# Patient Record
Sex: Female | Born: 1975 | Hispanic: No | Marital: Married | Smoking: Never smoker
Health system: Southern US, Community
[De-identification: ages and names within clinical notes are randomized; demographics above are authoritative.]

## PROBLEM LIST (undated history)

## (undated) HISTORY — PX: TUBAL LIGATION: SHX77

---

## 2009-06-15 ENCOUNTER — Inpatient Hospital Stay (HOSPITAL_COMMUNITY): Admission: RE | Admit: 2009-06-15 | Discharge: 2009-06-17 | Payer: Self-pay | Admitting: Obstetrics and Gynecology

## 2009-06-15 ENCOUNTER — Encounter (INDEPENDENT_AMBULATORY_CARE_PROVIDER_SITE_OTHER): Payer: Self-pay | Admitting: Obstetrics and Gynecology

## 2010-04-12 LAB — CBC
HCT: 38.3 % (ref 36.0–46.0)
Hemoglobin: 13.3 g/dL (ref 12.0–15.0)
MCHC: 34.4 g/dL (ref 30.0–36.0)
MCV: 93.3 fL (ref 78.0–100.0)
MCV: 94 fL (ref 78.0–100.0)
Platelets: 272 10*3/uL (ref 150–400)
RBC: 3.64 MIL/uL — ABNORMAL LOW (ref 3.87–5.11)
RDW: 13.9 % (ref 11.5–15.5)
RDW: 14 % (ref 11.5–15.5)

## 2014-12-01 ENCOUNTER — Other Ambulatory Visit: Payer: Self-pay | Admitting: Obstetrics and Gynecology

## 2014-12-01 DIAGNOSIS — N6489 Other specified disorders of breast: Secondary | ICD-10-CM

## 2014-12-04 ENCOUNTER — Ambulatory Visit
Admission: RE | Admit: 2014-12-04 | Discharge: 2014-12-04 | Disposition: A | Discharge status: Home / Self Care | Payer: BLUE CROSS/BLUE SHIELD | Source: Ambulatory Visit | Attending: Obstetrics and Gynecology | Admitting: Obstetrics and Gynecology

## 2014-12-04 DIAGNOSIS — N6489 Other specified disorders of breast: Secondary | ICD-10-CM

## 2016-08-24 LAB — LIPID PANEL
Cholesterol: 171 (ref 0–200)
HDL: 66 (ref 35–70)

## 2016-08-24 LAB — BASIC METABOLIC PANEL: Glucose: 88

## 2016-11-21 DIAGNOSIS — R1011 Right upper quadrant pain: Secondary | ICD-10-CM | POA: Diagnosis not present

## 2016-11-24 ENCOUNTER — Ambulatory Visit (INDEPENDENT_AMBULATORY_CARE_PROVIDER_SITE_OTHER): Payer: BLUE CROSS/BLUE SHIELD | Admitting: Family Medicine

## 2016-11-24 ENCOUNTER — Encounter: Payer: Self-pay | Admitting: Family Medicine

## 2016-11-24 VITALS — BP 142/86 | HR 91 | Temp 97.9°F | Ht 64.0 in | Wt 133.4 lb

## 2016-11-24 DIAGNOSIS — R1011 Right upper quadrant pain: Secondary | ICD-10-CM

## 2016-11-24 DIAGNOSIS — R1114 Bilious vomiting: Secondary | ICD-10-CM

## 2016-11-24 DIAGNOSIS — K8020 Calculus of gallbladder without cholecystitis without obstruction: Secondary | ICD-10-CM | POA: Diagnosis not present

## 2016-11-24 LAB — COMPREHENSIVE METABOLIC PANEL
ALT: 18 U/L (ref 0–35)
AST: 15 U/L (ref 0–37)
Albumin: 4.2 g/dL (ref 3.5–5.2)
Alkaline Phosphatase: 48 U/L (ref 39–117)
BILIRUBIN TOTAL: 0.6 mg/dL (ref 0.2–1.2)
BUN: 6 mg/dL (ref 6–23)
CALCIUM: 9.1 mg/dL (ref 8.4–10.5)
CHLORIDE: 104 meq/L (ref 96–112)
CO2: 27 meq/L (ref 19–32)
Creatinine, Ser: 0.66 mg/dL (ref 0.40–1.20)
GFR: 104.24 mL/min (ref >60.00)
Glucose, Bld: 108 mg/dL — ABNORMAL HIGH (ref 70–99)
Potassium: 3.9 mEq/L (ref 3.5–5.1)
Sodium: 138 mEq/L (ref 135–145)
Total Protein: 7.6 g/dL (ref 6.0–8.3)

## 2016-11-24 LAB — CBC
HCT: 40.2 % (ref 36.0–46.0)
HEMOGLOBIN: 13.6 g/dL (ref 12.0–15.0)
MCHC: 33.8 g/dL (ref 30.0–36.0)
MCV: 94.5 fl (ref 78.0–100.0)
Platelets: 273 10*3/uL (ref 150.0–400.0)
RBC: 4.25 Mil/uL (ref 3.87–5.11)
RDW: 12.4 % (ref 11.5–15.5)
WBC: 9.8 10*3/uL (ref 4.0–10.5)

## 2016-11-24 LAB — POCT URINE PREGNANCY: Preg Test, Ur: NEGATIVE

## 2016-11-24 LAB — LIPASE: LIPASE: 15 U/L (ref 11.0–59.0)

## 2016-11-24 NOTE — Patient Instructions (Addendum)
It was nice to see you today!  I am going to check your blood count and pancreatic enzymes today Then we will plan for an ultrasound tomorrow am at 9:30- nothing to eat or drink for at least 6 hours prior please!   Go to the imaging services suite on the ground floor [facility] the [facility] for your [facility]-I will be in touch with your result tomorrow  If anything unexpected happens tonight- severe pain, fever, etc- please go to the ER

## 2016-11-24 NOTE — Progress Notes (Addendum)
[facility] [address], [address] [place], [place] [id number] [phone]  Date:  11/24/2016   Name:  Melanie CuffRoxana L Mcgrath   DOB:  [birth year]   MRN:  [id number]  PCP:  Patient, No Pcp Per    Chief Complaint: Abdominal Pain (vomit last Thursday, feeling bloating, right side abdominal pain that comes and goes. Pt states pain is noticied more after eating. )   History of Present Illness:  Melanie Mcgrath is a 41 y.o. very pleasant female patient who presents with the following:  She is a new patient to use today- is here today with concern of abdominal pain   A week ago today she went out to eat- after the meal she felt bad, threw up a couple of times.   The next day she noted more generalized belly discomfort.  This waxed and waned over the next couple of days, and on Monday she went to UC. They did not find any cause for alarm, but did recommend following up with PCP.  They checked a CMP and was well per her report although we do not have a copy of her labs  However, she continues to notice mild RUQ discomfort which is worse after eating.    She has never had this issue in the past  No family history of gallstones that she knows of She is generally in good health No further vomiting No diarrhea No fever  She has 11 child- 233 year old daughter. She is in good health   LMP was about one month ago No urinary sx   There are no active problems to display for this patient.   History reviewed. No pertinent past medical history.  Past Surgical History:  Procedure Laterality Date  . CESAREAN SECTION  05/23/20111    Social History  Substance Use Topics  . Smoking status: Not on file  . Smokeless tobacco: Not on file  . Alcohol use Not on file    Family History  Problem Relation Age of Onset  . Hypertension Mother     Allergies not on file  Medication list has been reviewed and updated.  No current outpatient  prescriptions on file prior to visit.   No current facility-administered medications on file prior to visit.     Review of Systems:  As per HPI- otherwise negative.   Physical Examination: Vitals:   11/24/16 1104  BP: (!) 144/84  Pulse: 91  Temp: 97.9 F (36.6 C)  SpO2: 100%   Vitals:   11/24/16 1104  Weight: 133 lb 6.4 oz (60.5 kg)  Height: 5\' 4"  (1.626 m)   Body mass index is 22.9 kg/m. Ideal Body Weight: Weight in (lb) to have BMI = 25: 145.3  GEN: WDWN, NAD, Non-toxic, A & O x 3 HEENT: Atraumatic, Normocephalic. Neck supple. No masses, No LAD. Ears and Nose: No external deformity. CV: RRR, No M/G/R. No JVD. No thrill. No extra heart sounds. PULM: CTA B, no wheezes, crackles, rhonchi. No retractions. No resp. distress. No accessory muscle use. ABD: S, NT, ND, +BS. No rebound. No HSM. EXTR: No c/c/e NEURO Normal gait.  PSYCH: Normally interactive. Conversant. Not depressed or anxious appearing.  Calm demeanor.   Results for orders placed or performed in visit on 11/24/16  POCT urine pregnancy  Result Value Ref Range   Preg Test, Ur Negative Negative     Assessment and Plan: RUQ pain - Plan: Lipase  Bilious vomiting without nausea - Plan: CBC, Comprehensive metabolic panel, US Abdomen Limited RUQ, POCT urine pregnancy  Here today with RUQ discomfort suspicious for gallstones.  Her sx are not currently severe enough to suggest cholecystitis.  Will obtain labs as above and set her up for an [place] tomorrow- we are not able to get this today as she recently ate  Will plan further follow- up pending labs. If any serious change in her sx she will seek emergency care Will see her for a routine visit in a couple of months   Signed Abbe Amsterdam, MD shchedulled for an [place] tomorrow 9:30 am, NPO for 6 hours prior   Received labs  Results for orders placed or performed in visit on 11/24/16  CBC  Result Value Ref Range   WBC 9.8 4.0 - 10.5 K/uL   RBC 4.25 3.87 -  5.11 Mil/uL   Platelets 273.0 150.0 - 400.0 K/uL   Hemoglobin 13.6 12.0 - 15.0 g/dL   HCT 40.9 81.1 - 91.4 %   MCV 94.5 78.0 - 100.0 fl   MCHC 33.8 30.0 - 36.0 g/dL   RDW 78.2 95.6 - 21.3 %  Comprehensive metabolic panel  Result Value Ref Range   Sodium 138 135 - 145 mEq/L   Potassium 3.9 3.5 - 5.1 mEq/L   Chloride 104 96 - 112 mEq/L   CO2 27 19 - 32 mEq/L   Glucose, Bld 108 (H) 70 - 99 mg/dL   BUN 6 6 - 23 mg/dL   Creatinine, Ser 0.86 0.40 - 1.20 mg/dL   Total Bilirubin 0.6 0.2 - 1.2 mg/dL   Alkaline Phosphatase 48 39 - 117 U/L   AST 15 0 - 37 U/L   ALT 18 0 - 35 U/L   Total Protein 7.6 6.0 - 8.3 g/dL   Albumin 4.2 3.5 - 5.2 g/dL   Calcium 9.1 8.4 - 57.8 mg/dL   GFR 469.62 >95.28 mL/min  Lipase  Result Value Ref Range   Lipase 15.0 11.0 - 59.0 U/L  POCT urine pregnancy  Result Value Ref Range   Preg Test, Ur Negative Negative   Await [place] tomorrow  Received her [place] on 11/2-  Called pt, she is feeling better, pain is decreased, she is eating.  Discussed her case with general surgery MD- her clinical picture is not emergent, will have her seen next week Advised to pt eat a low fat diet,.  If any worsening over the weekend she will go to the ER or call me US Abdomen Limited Ruq  Result Date: 11/25/2016 CLINICAL DATA:  Vomiting.  Bloating. EXAM: ULTRASOUND ABDOMEN LIMITED RIGHT UPPER QUADRANT COMPARISON:  No prior . FINDINGS: Gallbladder: Multiple gallstones. Gallbladder wall is thickened at 10.5 mm. Mild amount of pericholecystic fluid noted. Cholecystitis cannot be excluded. Negative Murphy sign. Common bile duct: Diameter: 5.5 mm Liver: Two tiny hyperechoic hepatic nodules measuring 7 and 6 mm are noted, most likely tiny hemangiomas. Portal vein is patent on color Doppler imaging with normal direction of blood flow towards the liver. IMPRESSION: 1. Multiple gallstones. Prominent thickening of the gallbladder wall at 10.5 mm. Mild amount of pericholecystic fluid. Cholecystitis cannot  be excluded. No biliary distention. 2. Two tiny 7 at 6 mm hyperechoic hepatic nodules, most likely tiny hemangiomas P Electronically Signed   ByMaisie Fus  Register   On: 11/25/2016 10:20

## 2016-11-25 ENCOUNTER — Ambulatory Visit (HOSPITAL_BASED_OUTPATIENT_CLINIC_OR_DEPARTMENT_OTHER)
Admission: RE | Admit: 2016-11-25 | Discharge: 2016-11-25 | Disposition: A | Discharge status: Home / Self Care | Payer: BLUE CROSS/BLUE SHIELD | Source: Ambulatory Visit | Attending: Family Medicine | Admitting: Family Medicine

## 2016-11-25 DIAGNOSIS — K769 Liver disease, unspecified: Secondary | ICD-10-CM | POA: Diagnosis not present

## 2016-11-25 DIAGNOSIS — K8020 Calculus of gallbladder without cholecystitis without obstruction: Secondary | ICD-10-CM | POA: Insufficient documentation

## 2016-11-25 DIAGNOSIS — R1114 Bilious vomiting: Secondary | ICD-10-CM | POA: Diagnosis not present

## 2016-11-25 DIAGNOSIS — K829 Disease of gallbladder, unspecified: Secondary | ICD-10-CM | POA: Insufficient documentation

## 2016-11-25 NOTE — Addendum Note (Signed)
Addended by: Abbe AmsterdamOPLAND, Ketan Renz C on: 11/25/2016 01:47 PM   Modules accepted: Orders

## 2016-11-28 ENCOUNTER — Encounter: Payer: Self-pay | Admitting: Family Medicine

## 2016-11-28 DIAGNOSIS — K8020 Calculus of gallbladder without cholecystitis without obstruction: Secondary | ICD-10-CM | POA: Diagnosis not present

## 2016-12-01 NOTE — Progress Notes (Unsigned)
Pt called to say she is doing fine and overall doing well. Pt states she is just updating Dr Patsy Lageropland since Dr Patsy Lageropland called her to check on her. Pt also states her surgery date is 12/20/16.

## 2016-12-20 ENCOUNTER — Other Ambulatory Visit: Payer: Self-pay | Admitting: General Surgery

## 2016-12-20 DIAGNOSIS — K8010 Calculus of gallbladder with chronic cholecystitis without obstruction: Secondary | ICD-10-CM | POA: Diagnosis not present

## 2016-12-20 HISTORY — PX: GALLBLADDER SURGERY: SHX652

## 2017-02-11 NOTE — Progress Notes (Signed)
[facility] [address], [address] [place], [place] [address] [phone]  Date:  02/13/2017   Name:  Melanie Mcgrath   DOB:  [birth year]   MRN:  [id number]  PCP:  Pearline Cables, MD    Chief Complaint: Establish Care (Pt here to est care. )   History of Present Illness:  Melanie Mcgrath is a 42 y.o. very pleasant female patient who presents with the following:  Here today as a new patient to establish care- however I did see her in November as well for a sick visit:  A week ago today she went out to eat- after the meal she felt bad, threw up a couple of times.   The next day she noted more generalized belly discomfort.  This waxed and waned over the next couple of days, and on Monday she went to UC. They did not find any cause for alarm, but did recommend following up with PCP.  They checked a CMP and was well per her report although we do not have a copy of her labs  However, she continues to notice mild RUQ discomfort which is worse after eating.    She has never had this issue in the past  No family history of gallstones that she knows of She is generally in good health No further vomiting No diarrhea No fever  She has 14 child- 26 year old daughter. She is in good health   She did end up having gallstones on [place] and was referred to general surgery- she did end up getting a chole on 12/20/16.  She feels like she is doing well after her chole- she has not noted any issues with digestion.   For GYN she sees Dr. Estanislado Pandy and is seeing her next month for her mammo and pap   At [employer]- her employer- they do basic labs annually. She does not have the numbers with her, but remembers that everything looked good last time. 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 Her family moved here from [place] in 2006, but she is originally from [place] and moved here with her husband  years ago.   She did travel to [place] a few years ago- maybe 6 years- and thinks that she had all of her shots then.  She will bring me a copy of this  She thinks that she had a tetanus  For exercise she likes to work out at [employer] and do yoga. She would like to go back to the gym, but wanted to make sure that all is ok for her to start back   She did notice a mild cough yesterday, but otherwise feels pretty well No fever, body aches, or chills Mild ST as well   There are no active problems to display for this patient.   History reviewed. No pertinent past medical history.  Past Surgical History:  Procedure Laterality Date  . CESAREAN SECTION  05/23/20111  . GALLBLADDER SURGERY  12/20/2016    Social History   Tobacco Use  . Smoking status: Never Smoker  . Smokeless tobacco: Never Used  Substance Use Topics  . Alcohol use: Yes    Comment: occasional  . Drug use: No    Family History  Problem Relation Age of Onset  . Hypertension Mother     No Known Allergies  Medication list has been reviewed  and updated.  No current outpatient medications on file prior to visit.   No current facility-administered medications on file prior to visit.     Review of Systems:  As per HPI- otherwise negative.   Physical Examination: Vitals:   02/13/17 0833  BP: 122/82  Pulse: 99  Temp: 98.2 F (36.8 C)  SpO2: 98%   Vitals:   02/13/17 0833  Weight: 131 lb 6.4 oz (59.6 kg)  Height: 5' 4.8" (1.646 m)   Body mass index is 22 kg/m. Ideal Body Weight: Weight in (lb) to have BMI = 25: 149  GEN: WDWN, NAD, Non-toxic, A & O x 3, slim build, looks well HEENT: Atraumatic, Normocephalic. Neck supple. No masses, No LAD.  Bilateral TM wnl, oropharynx normal.  PEERL,EOMI.   Right external ear canal is slightly inflamed  Ears and Nose: No external deformity. CV: RRR, No M/G/R. No JVD. No thrill. No extra heart sounds. PULM: CTA B, no wheezes, crackles, rhonchi. No retractions. No  resp. distress. No accessory muscle use. Well healed lap chole incisions, non tender ABD: S, NT, ND. No rebound. No HSM. EXTR: No c/c/e NEURO Normal gait.  PSYCH: Normally interactive. Conversant. Not depressed or anxious appearing.  Calm demeanor.    Assessment and Plan: Viral URI  Immunizations reviewed and up to date  S/P laparoscopic cholecystectomy  Physical deconditioning  Here today for a follow-up visit She has a mild illness right now- she thinks that she will be fine but knows to contact me if not feeling better in a few days, Sooner if worse.  We saw her last fall and she ended up having a lap chole, she has done very well since her operation Ok to go back to exercise- she also got the ok from her surgeon form her report- but advised her to get back in shape gradually  She will provide me with her labs and immunization records Otherwise plan to visit in one year  See patient instructions for more details.     Signed Abbe AmsterdamJessica Talayia Hjort, MD

## 2017-02-13 ENCOUNTER — Encounter: Payer: Self-pay | Admitting: Family Medicine

## 2017-02-13 ENCOUNTER — Ambulatory Visit: Payer: BLUE CROSS/BLUE SHIELD | Admitting: Family Medicine

## 2017-02-13 VITALS — BP 122/82 | HR 99 | Temp 98.2°F | Ht 64.8 in | Wt 131.4 lb

## 2017-02-13 DIAGNOSIS — J069 Acute upper respiratory infection, unspecified: Secondary | ICD-10-CM

## 2017-02-13 DIAGNOSIS — Z7689 Persons encountering health services in other specified circumstances: Secondary | ICD-10-CM | POA: Diagnosis not present

## 2017-02-13 DIAGNOSIS — Z9049 Acquired absence of other specified parts of digestive tract: Secondary | ICD-10-CM | POA: Diagnosis not present

## 2017-02-13 DIAGNOSIS — Z9229 Personal history of other drug therapy: Secondary | ICD-10-CM

## 2017-02-13 DIAGNOSIS — R5381 Other malaise: Secondary | ICD-10-CM | POA: Diagnosis not present

## 2017-02-13 NOTE — Patient Instructions (Signed)
It was nice to see you again today!   Have a wonderful time skiing.  If you are still sick in a few days please let me know  Please send me a copy of your labs from [facility]- from last year if you can find them, if not then after you have them this summer is fine I would also like to try and find your last tetanus shot- please let me know what records you have at home I think you are fine to go back to exercise- just take it slowly as you are getting back to your routine, and back off if you have any pain  Take care!   Health Maintenance, Female Adopting a healthy lifestyle and getting preventive care can go a long way to promote health and wellness. Talk with your health care provider about what schedule of regular examinations is right for you. This is a good chance for you to check in with your provider about disease prevention and staying healthy. In between checkups, there are plenty of things you can do on your own. Experts have done a lot of research about which lifestyle changes and preventive measures are most likely to keep you healthy. Ask your health care provider for more information. Weight and diet Eat a healthy diet  Be sure to include plenty of vegetables, fruits, low-fat dairy products, and lean protein.  Do not eat a lot of foods high in solid fats, added sugars, or salt.  Get regular exercise. This is one of the most important things you can do for your health. ? Most adults should exercise for at least 150 minutes each week. The exercise should increase your heart rate and make you sweat (moderate-intensity exercise). ? Most adults should also do strengthening exercises at least twice a week. This is in addition to the moderate-intensity exercise.  Maintain a healthy weight  Body mass index (BMI) is a measurement that can be used to identify possible weight problems. It estimates body fat based on height and weight. Your health care provider can help determine your BMI and  help you achieve or maintain a healthy weight.  For females 43 years of age and older: ? A BMI below 18.5 is considered underweight. ? A BMI of 18.5 to 24.9 is normal. ? A BMI of 25 to 29.9 is considered overweight. ? A BMI of 30 and above is considered obese.  Watch levels of cholesterol and blood lipids  You should start having your blood tested for lipids and cholesterol at 43 years of age, then have this test every 5 years.  You may need to have your cholesterol levels checked more often if: ? Your lipid or cholesterol levels are high. ? You are older than 44 years of age. ? You are at high risk for heart disease.  Cancer screening Lung Cancer  Lung cancer screening is recommended for adults 25-43 years old who are at high risk for lung cancer because of a history of smoking.  A yearly low-dose CT scan of the lungs is recommended for people who: ? Currently smoke. ? Have quit within the past 15 years. ? Have at least a 30-pack-year history of smoking. A pack year is smoking an average of one pack of cigarettes a day for 1 year.  Yearly screening should continue until it has been 15 years since you quit.  Yearly screening should stop if you develop a health problem that would prevent you from having lung cancer treatment.  Breast Cancer  Practice breast self-awareness. This means understanding how your breasts normally appear and feel.  It also means doing regular breast self-exams. Let your health care provider know about any changes, no matter how small.  If you are in your 20s or 30s, you should have a clinical breast exam (CBE) by a health care provider every 1-3 years as part of a regular health exam.  If you are 57 or older, have a CBE every year. Also consider having a breast X-ray (mammogram) every year.  If you have a family history of breast cancer, talk to your health care provider about genetic screening.  If you are at high risk for breast cancer, talk to  your health care provider about having an MRI and a mammogram every year.  Breast cancer gene (BRCA) assessment is recommended for women who have family members with BRCA-related cancers. BRCA-related cancers include: ? Breast. ? Ovarian. ? Tubal. ? Peritoneal cancers.  Results of the assessment will determine the need for genetic counseling and BRCA1 and BRCA2 testing.  Cervical Cancer Your health care provider may recommend that you be screened regularly for cancer of the pelvic organs (ovaries, uterus, and vagina). This screening involves a pelvic examination, including checking for microscopic changes to the surface of your cervix (Pap test). You may be encouraged to have this screening done every 3 years, beginning at age 52.  For women ages 22-65, health care providers may recommend pelvic exams and Pap testing every 3 years, or they may recommend the Pap and pelvic exam, combined with testing for human papilloma virus (HPV), every 5 years. Some types of HPV increase your risk of cervical cancer. Testing for HPV may also be done on women of any age with unclear Pap test results.  Other health care providers may not recommend any screening for nonpregnant women who are considered low risk for pelvic cancer and who do not have symptoms. Ask your health care provider if a screening pelvic exam is right for you.  If you have had past treatment for cervical cancer or a condition that could lead to cancer, you need Pap tests and screening for cancer for at least 20 years after your treatment. If Pap tests have been discontinued, your risk factors (such as having a new sexual partner) need to be reassessed to determine if screening should resume. Some women have medical problems that increase the chance of getting cervical cancer. In these cases, your health care provider may recommend more frequent screening and Pap tests.  Colorectal Cancer  This type of cancer can be detected and often  prevented.  Routine colorectal cancer screening usually begins at 43 years of age and continues through 43 years of age.  Your health care provider may recommend screening at an earlier age if you have risk factors for colon cancer.  Your health care provider may also recommend using home test kits to check for hidden blood in the stool.  A small camera at the end of a tube can be used to examine your colon directly (sigmoidoscopy or colonoscopy). This is done to check for the earliest forms of colorectal cancer.  Routine screening usually begins at age 64.  Direct examination of the colon should be repeated every 5-10 years through 43 years of age. However, you may need to be screened more often if early forms of precancerous polyps or small growths are found.  Skin Cancer  Check your skin from head to toe regularly.  Tell  your health care provider about any new moles or changes in moles, especially if there is a change in a mole's shape or color.  Also tell your health care provider if you have a mole that is larger than the size of a pencil eraser.  Always use sunscreen. Apply sunscreen liberally and repeatedly throughout the day.  Protect yourself by wearing long sleeves, pants, a wide-brimmed hat, and sunglasses whenever you are outside.  Heart disease, diabetes, and high blood pressure  High blood pressure causes heart disease and increases the risk of stroke. High blood pressure is more likely to develop in: ? People who have blood pressure in the high end of the normal range (130-139/85-89 mm Hg). ? People who are overweight or obese. ? People who are African American.  If you are 80-36 years of age, have your blood pressure checked every 3-5 years. If you are 28 years of age or older, have your blood pressure checked every year. You should have your blood pressure measured twice-once when you are at a hospital or clinic, and once when you are not at a hospital or clinic.  Record the average of the two measurements. To check your blood pressure when you are not at a hospital or clinic, you can use: ? An automated blood pressure machine at a pharmacy. ? A home blood pressure monitor.  If you are between 95 years and 53 years old, ask your health care provider if you should take aspirin to prevent strokes.  Have regular diabetes screenings. This involves taking a blood sample to check your fasting blood sugar level. ? If you are at a normal weight and have a low risk for diabetes, have this test once every three years after 43 years of age. ? If you are overweight and have a high risk for diabetes, consider being tested at a younger age or more often. Preventing infection Hepatitis B  If you have a higher risk for hepatitis B, you should be screened for this virus. You are considered at high risk for hepatitis B if: ? You were born in a country where hepatitis B is common. Ask your health care provider which countries are considered high risk. ? Your parents were born in a high-risk country, and you have not been immunized against hepatitis B (hepatitis B vaccine). ? You have HIV or AIDS. ? You use needles to inject street drugs. ? You live with someone who has hepatitis B. ? You have had sex with someone who has hepatitis B. ? You get hemodialysis treatment. ? You take certain medicines for conditions, including cancer, organ transplantation, and autoimmune conditions.  Hepatitis C  Blood testing is recommended for: ? Everyone born from [birth year] through 1966. ? Anyone with known risk factors for hepatitis C.  Sexually transmitted infections (STIs)  You should be screened for sexually transmitted infections (STIs) including gonorrhea and chlamydia if: ? You are sexually active and are younger than 43 years of age. ? You are older than 43 years of age and your health care provider tells you that you are at risk for this type of infection. ? Your sexual  activity has changed since you were last screened and you are at an increased risk for chlamydia or gonorrhea. Ask your health care provider if you are at risk.  If you do not have HIV, but are at risk, it may be recommended that you take a prescription medicine daily to prevent HIV infection. This is called pre-exposure  prophylaxis (PrEP). You are considered at risk if: ? You are sexually active and do not regularly use condoms or know the HIV status of your partner(s). ? You take drugs by injection. ? You are sexually active with a partner who has HIV.  Talk with your health care provider about whether you are at high risk of being infected with HIV. If you choose to begin PrEP, you should first be tested for HIV. You should then be tested every 3 months for as long as you are taking PrEP. Pregnancy  If you are premenopausal and you may become pregnant, ask your health care provider about preconception counseling.  If you may become pregnant, take 400 to 800 micrograms (mcg) of folic acid every day.  If you want to prevent pregnancy, talk to your health care provider about birth control (contraception). Osteoporosis and menopause  Osteoporosis is a disease in which the bones lose minerals and strength with aging. This can result in serious bone fractures. Your risk for osteoporosis can be identified using a bone density scan.  If you are 83 years of age or older, or if you are at risk for osteoporosis and fractures, ask your health care provider if you should be screened.  Ask your health care provider whether you should take a calcium or vitamin D supplement to lower your risk for osteoporosis.  Menopause may have certain physical symptoms and risks.  Hormone replacement therapy may reduce some of these symptoms and risks. Talk to your health care provider about whether hormone replacement therapy is right for you. Follow these instructions at home:  Schedule regular health, dental,  and eye exams.  Stay current with your immunizations.  Do not use any tobacco products including cigarettes, chewing tobacco, or electronic cigarettes.  If you are pregnant, do not drink alcohol.  If you are breastfeeding, limit how much and how often you drink alcohol.  Limit alcohol intake to no more than 1 drink per day for nonpregnant women. One drink equals 12 ounces of beer, 5 ounces of wine, or 1 ounces of hard liquor.  Do not use street drugs.  Do not share needles.  Ask your health care provider for help if you need support or information about quitting drugs.  Tell your health care provider if you often feel depressed.  Tell your health care provider if you have ever been abused or do not feel safe at home. This information is not intended to replace advice given to you by your health care provider. Make sure you discuss any questions you have with your health care provider. Document Released: 07/26/2010 Document Revised: 06/18/2015 Document Reviewed: 10/14/2014 Elsevier Interactive Patient Education  [employer].

## 2017-02-24 ENCOUNTER — Encounter: Payer: Self-pay | Admitting: Family Medicine

## 2017-05-29 DIAGNOSIS — Z6824 Body mass index (BMI) 24.0-24.9, adult: Secondary | ICD-10-CM | POA: Diagnosis not present

## 2017-05-29 DIAGNOSIS — Z1231 Encounter for screening mammogram for malignant neoplasm of breast: Secondary | ICD-10-CM | POA: Diagnosis not present

## 2017-05-29 DIAGNOSIS — Z01419 Encounter for gynecological examination (general) (routine) without abnormal findings: Secondary | ICD-10-CM | POA: Diagnosis not present

## 2017-05-29 DIAGNOSIS — Z124 Encounter for screening for malignant neoplasm of cervix: Secondary | ICD-10-CM | POA: Diagnosis not present

## 2018-01-10 ENCOUNTER — Ambulatory Visit: Payer: Self-pay

## 2018-01-10 MED ORDER — OSELTAMIVIR PHOSPHATE 75 MG PO CAPS: 75.0000 mg | ORAL_CAPSULE | Freq: Every day | ORAL | 0 refills | Status: DC

## 2018-01-10 NOTE — Telephone Encounter (Signed)
Patient's husband called and says their daughter was tested positive for the flu today and her pediatrician recommended they contact their doctor to be prescribed Tamiflu to prevent them from catching the flu from their daughter. I asked about symptoms and he says his wife is does not have any symptoms. I advised I will send this over to Dr. Patsy Lageropland and someone from the office will call with her recommendation, he verbalized understanding.  Reason for Disposition . [1] Influenza EXPOSURE within last 48 hours (2 days) AND [2] NOT HIGH RISK AND [3] strongly requests antiviral medication  Answer Assessment - Initial Assessment Questions 1. TYPE of EXPOSURE: "How were you exposed?" (e.g., close contact, not a close contact)     Close contact with daughter 2. DATE of EXPOSURE: "When did the exposure occur?" (e.g., hour, days, weeks)     2 days ago daughter had fever, went to doctor today and confirmed she has flu 3. PREGNANCY: "Is there any chance you are pregnant?" "When was your last menstrual period?"    No 4. HIGH RISK for COMPLICATIONS: "Do you have any heart or lung problems? Do you have a weakened immune system?" (e.g., CHF, COPD, asthma, HIV positive, chemotherapy, renal failure, diabetes mellitus, sickle cell anemia)    No 5. SYMPTOMS: "Do you have any symptoms?" (e.g., cough, fever, sore throat, difficulty breathing).     No  Protocols used: INFLUENZA EXPOSURE-A-AH

## 2018-01-10 NOTE — Telephone Encounter (Signed)
Called back and spoke with Greggory StallionGeorge- ok to rx tamiflu.  Will take care of this now

## 2018-02-15 ENCOUNTER — Encounter: Payer: BLUE CROSS/BLUE SHIELD | Admitting: Family Medicine

## 2018-03-13 NOTE — Progress Notes (Addendum)
[facility] [address], [address] [place], [place] [address] [phone]  Date:  03/15/2018   Name:  Melanie Mcgrath   DOB:  1975-08-18   MRN:  [id number]  PCP:  Pearline Cables, MD    Chief Complaint: Annual Exam (declines flu shot, sees gyn)   History of Present Illness:  Melanie Mcgrath is a 43 y.o. very pleasant female patient who presents with the following:  Generally healthy woman here today for a physical-I have seen her a couple times in the past, but this is her first physical together I last saw her in January 2019 for sick visit She had her gallbladder removed in 2018- she does not notice any SE from this operation  Pap: per GYN, she sees Dr. Roanna Banning  Mammogram: per GYN - May 2019 Labs: she is not fasting, just had a smoothie  Immunizations: she declines a flu shot  She also declines a tetanus booster   She is married, they have 1 daughter.  She is now 8.5, 3rd grade  He mother did have HTN in her 49s.  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   History reviewed. No pertinent past medical history.  Past Surgical History:  Procedure Laterality Date  . CESAREAN SECTION  05/23/20111  . GALLBLADDER SURGERY  12/20/2016    Social History   Tobacco Use  . Smoking status: Never Smoker  . Smokeless tobacco: Never Used  Substance Use Topics  . Alcohol use: Yes    Comment: occasional  . Drug use: No    Family History  Problem Relation Age of Onset  . Hypertension Mother     No Known Allergies  Medication list has been reviewed and updated.  No current outpatient medications on file prior to visit.   No current  facility-administered medications on file prior to visit.     Review of Systems:  As per HPI- otherwise negative. No fever chills, no chest pain or shortness of breath.  No skin changes.  No concerns about mood or depression  BP Readings from Last 3 Encounters:  03/15/18 136/82  02/13/17 122/82  11/24/16 (!) 142/86     Physical Examination: Vitals:   03/15/18 0813  BP: 136/82  Pulse: 84  Resp: 16  Temp: 98.1 F (36.7 C)  SpO2: 100%   Vitals:   03/15/18 0813  Weight: 139 lb (63 kg)  Height: 5\' 5"  (1.651 m)   Body mass index is 23.13 kg/m. Ideal Body Weight: Weight in (lb) to have BMI = 25: 149.9  GEN: WDWN, NAD, Non-toxic, A & O x 3, normal weight, looks well HEENT: Atraumatic, Normocephalic. Neck supple. No masses, No LAD.  Bilateral TM wnl, oropharynx normal.  PEERL,EOMI.   Ears and Nose: No external deformity. CV: RRR, No M/G/R. No JVD. No thrill. No extra heart sounds. PULM: CTA B, no wheezes, crackles, rhonchi. No retractions. No resp. distress. No accessory muscle use. ABD: S, NT, ND. No rebound. No HSM. EXTR: No c/c/e NEURO Normal gait.  PSYCH: Normally interactive. Conversant. Not depressed or anxious appearing.  Calm demeanor.  She has some corns and  calluses on her feet, likely caused by wearing dress shoes as is appropriate for her work  Assessment and Plan: Physical exam  Screening for diabetes mellitus - Plan: Comprehensive metabolic panel, Hemoglobin A1c  Screening for hyperlipidemia - Plan: Lipid panel  Screening for deficiency anemia - Plan: CBC  Here today for complete physical.  She is generally in very good health, though we are watching her blood pressure. I encouraged her to try to exercise more Declined a flu shot today Pap and mammo-per her GYN provider Reminded that she needs a tetanus booster if any significant wound Will plan further follow- up pending labs.   Signed Abbe Amsterdam, MD  Received her labs, message to  patient  Results for orders placed or performed in visit on 03/15/18  CBC  Result Value Ref Range   WBC 7.3 4.0 - 10.5 K/uL   RBC 4.26 3.87 - 5.11 Mil/uL   Platelets 277.0 150.0 - 400.0 K/uL   Hemoglobin 13.6 12.0 - 15.0 g/dL   HCT 79.8 92.1 - 19.4 %   MCV 93.7 78.0 - 100.0 fl   MCHC 34.1 30.0 - 36.0 g/dL   RDW 17.4 08.1 - 44.8 %  Comprehensive metabolic panel  Result Value Ref Range   Sodium 141 135 - 145 mEq/L   Potassium 4.3 3.5 - 5.1 mEq/L   Chloride 110 96 - 112 mEq/L   CO2 23 19 - 32 mEq/L   Glucose, Bld 86 70 - 99 mg/dL   BUN 6 6 - 23 mg/dL   Creatinine, Ser 1.85 0.40 - 1.20 mg/dL   Total Bilirubin 0.5 0.2 - 1.2 mg/dL   Alkaline Phosphatase 42 39 - 117 U/L   AST 17 0 - 37 U/L   ALT 15 0 - 35 U/L   Total Protein 6.9 6.0 - 8.3 g/dL   Albumin 4.4 3.5 - 5.2 g/dL   Calcium 9.1 8.4 - 63.1 mg/dL   GFR 49.70 >26.37 mL/min  Hemoglobin A1c  Result Value Ref Range   Hgb A1c MFr Bld 5.1 4.6 - 6.5 %  Lipid panel  Result Value Ref Range   Cholesterol 157 0 - 200 mg/dL   Triglycerides 858.8 (H) 0.0 - 149.0 mg/dL   HDL 50.27 >74.12 mg/dL   VLDL 87.8 0.0 - 67.6 mg/dL   LDL Cholesterol 69 0 - 99 mg/dL   Total CHOL/HDL Ratio 3    NonHDL 105.12

## 2018-03-13 NOTE — Patient Instructions (Addendum)
It was great to see you today, I will be in touch with your labs I would encourage you to start a more regular exercise program, as your schedule allows.  Even 15 to 20 minutes a day can be helpful. Remember that you are likely due for a tetanus booster, they have any significant wound please be sure to get this done right away  Take care, hope you have a great rest of the spring.  We can plan to visit in about 1 year  Health Maintenance, Female Adopting a healthy lifestyle and getting preventive care can go a long way to promote health and wellness. Talk with your health care provider about what schedule of regular examinations is right for you. This is a good chance for you to check in with your provider about disease prevention and staying healthy. In between checkups, there are plenty of things you can do on your own. Experts have done a lot of research about which lifestyle changes and preventive measures are most likely to keep you healthy. Ask your health care provider for more information. Weight and diet Eat a healthy diet  Be sure to include plenty of vegetables, fruits, low-fat dairy products, and lean protein.  Do not eat a lot of foods high in solid fats, added sugars, or salt.  Get regular exercise. This is one of the most important things you can do for your health. ? Most adults should exercise for at least 150 minutes each week. The exercise should increase your heart rate and make you sweat (moderate-intensity exercise). ? Most adults should also do strengthening exercises at least twice a week. This is in addition to the moderate-intensity exercise. Maintain a healthy weight  Body mass index (BMI) is a measurement that can be used to identify possible weight problems. It estimates body fat based on height and weight. Your health care provider can help determine your BMI and help you achieve or maintain a healthy weight.  For females 28 years of age and older: ? A BMI below  18.5 is considered underweight. ? A BMI of 18.5 to 24.9 is normal. ? A BMI of 25 to 29.9 is considered overweight. ? A BMI of 30 and above is considered obese. Watch levels of cholesterol and blood lipids  You should start having your blood tested for lipids and cholesterol at 44 years of age, then have this test every 5 years.  You may need to have your cholesterol levels checked more often if: ? Your lipid or cholesterol levels are high. ? You are older than 44 years of age. ? You are at high risk for heart disease. Cancer screening Lung Cancer  Lung cancer screening is recommended for adults 56-61 years old who are at high risk for lung cancer because of a history of smoking.  A yearly low-dose CT scan of the lungs is recommended for people who: ? Currently smoke. ? Have quit within the past 15 years. ? Have at least a 30-pack-year history of smoking. A pack year is smoking an average of one pack of cigarettes a day for 1 year.  Yearly screening should continue until it has been 15 years since you quit.  Yearly screening should stop if you develop a health problem that would prevent you from having lung cancer treatment. Breast Cancer  Practice breast self-awareness. This means understanding how your breasts normally appear and feel.  It also means doing regular breast self-exams. Let your health care provider know about any  changes, no matter how small.  If you are in your 20s or 30s, you should have a clinical breast exam (CBE) by a health care provider every 1-3 years as part of a regular health exam.  If you are 84 or older, have a CBE every year. Also consider having a breast X-ray (mammogram) every year.  If you have a family history of breast cancer, talk to your health care provider about genetic screening.  If you are at high risk for breast cancer, talk to your health care provider about having an MRI and a mammogram every year.  Breast cancer gene (BRCA)  assessment is recommended for women who have family members with BRCA-related cancers. BRCA-related cancers include: ? Breast. ? Ovarian. ? Tubal. ? Peritoneal cancers.  Results of the assessment will determine the need for genetic counseling and BRCA1 and BRCA2 testing. Cervical Cancer Your health care provider may recommend that you be screened regularly for cancer of the pelvic organs (ovaries, uterus, and vagina). This screening involves a pelvic examination, including checking for microscopic changes to the surface of your cervix (Pap test). You may be encouraged to have this screening done every 3 years, beginning at age 57.  For women ages 50-65, health care providers may recommend pelvic exams and Pap testing every 3 years, or they may recommend the Pap and pelvic exam, combined with testing for human papilloma virus (HPV), every 5 years. Some types of HPV increase your risk of cervical cancer. Testing for HPV may also be done on women of any age with unclear Pap test results.  Other health care providers may not recommend any screening for nonpregnant women who are considered low risk for pelvic cancer and who do not have symptoms. Ask your health care provider if a screening pelvic exam is right for you.  If you have had past treatment for cervical cancer or a condition that could lead to cancer, you need Pap tests and screening for cancer for at least 20 years after your treatment. If Pap tests have been discontinued, your risk factors (such as having a new sexual partner) need to be reassessed to determine if screening should resume. Some women have medical problems that increase the chance of getting cervical cancer. In these cases, your health care provider may recommend more frequent screening and Pap tests. Colorectal Cancer  This type of cancer can be detected and often prevented.  Routine colorectal cancer screening usually begins at 43 years of age and continues through 44 years  of age.  Your health care provider may recommend screening at an earlier age if you have risk factors for colon cancer.  Your health care provider may also recommend using home test kits to check for hidden blood in the stool.  A small camera at the end of a tube can be used to examine your colon directly (sigmoidoscopy or colonoscopy). This is done to check for the earliest forms of colorectal cancer.  Routine screening usually begins at age 80.  Direct examination of the colon should be repeated every 5-10 years through 44 years of age. However, you may need to be screened more often if early forms of precancerous polyps or small growths are found. Skin Cancer  Check your skin from head to toe regularly.  Tell your health care provider about any new moles or changes in moles, especially if there is a change in a mole's shape or color.  Also tell your health care provider if you have a  mole that is larger than the size of a pencil eraser.  Always use sunscreen. Apply sunscreen liberally and repeatedly throughout the day.  Protect yourself by wearing long sleeves, pants, a wide-brimmed hat, and sunglasses whenever you are outside. Heart disease, diabetes, and high blood pressure  High blood pressure causes heart disease and increases the risk of stroke. High blood pressure is more likely to develop in: ? People who have blood pressure in the high end of the normal range (130-139/85-89 mm Hg). ? People who are overweight or obese. ? People who are African American.  If you are 55-79 years of age, have your blood pressure checked every 3-5 years. If you are 53 years of age or older, have your blood pressure checked every year. You should have your blood pressure measured twice-once when you are at a hospital or clinic, and once when you are not at a hospital or clinic. Record the average of the two measurements. To check your blood pressure when you are not at a hospital or clinic, you can  use: ? An automated blood pressure machine at a pharmacy. ? A home blood pressure monitor.  If you are between 27 years and 13 years old, ask your health care provider if you should take aspirin to prevent strokes.  Have regular diabetes screenings. This involves taking a blood sample to check your fasting blood sugar level. ? If you are at a normal weight and have a low risk for diabetes, have this test once every three years after 44 years of age. ? If you are overweight and have a high risk for diabetes, consider being tested at a younger age or more often. Preventing infection Hepatitis B  If you have a higher risk for hepatitis B, you should be screened for this virus. You are considered at high risk for hepatitis B if: ? You were born in a country where hepatitis B is common. Ask your health care provider which countries are considered high risk. ? Your parents were born in a high-risk country, and you have not been immunized against hepatitis B (hepatitis B vaccine). ? You have HIV or AIDS. ? You use needles to inject street drugs. ? You live with someone who has hepatitis B. ? You have had sex with someone who has hepatitis B. ? You get hemodialysis treatment. ? You take certain medicines for conditions, including cancer, organ transplantation, and autoimmune conditions. Hepatitis C  Blood testing is recommended for: ? Everyone born from [birth year] through 1966. ? Anyone with known risk factors for hepatitis C. Sexually transmitted infections (STIs)  You should be screened for sexually transmitted infections (STIs) including gonorrhea and chlamydia if: ? You are sexually active and are younger than 44 years of age. ? You are older than 44 years of age and your health care provider tells you that you are at risk for this type of infection. ? Your sexual activity has changed since you were last screened and you are at an increased risk for chlamydia or gonorrhea. Ask your health care  provider if you are at risk.  If you do not have HIV, but are at risk, it may be recommended that you take a prescription medicine daily to prevent HIV infection. This is called pre-exposure prophylaxis (PrEP). You are considered at risk if: ? You are sexually active and do not regularly use condoms or know the HIV status of your partner(s). ? You take drugs by injection. ? You are sexually active  with a partner who has HIV. Talk with your health care provider about whether you are at high risk of being infected with HIV. If you choose to begin PrEP, you should first be tested for HIV. You should then be tested every 3 months for as long as you are taking PrEP. Pregnancy  If you are premenopausal and you may become pregnant, ask your health care provider about preconception counseling.  If you may become pregnant, take 400 to 800 micrograms (mcg) of folic acid every day.  If you want to prevent pregnancy, talk to your health care provider about birth control (contraception). Osteoporosis and menopause  Osteoporosis is a disease in which the bones lose minerals and strength with aging. This can result in serious bone fractures. Your risk for osteoporosis can be identified using a bone density scan.  If you are 79 years of age or older, or if you are at risk for osteoporosis and fractures, ask your health care provider if you should be screened.  Ask your health care provider whether you should take a calcium or vitamin D supplement to lower your risk for osteoporosis.  Menopause may have certain physical symptoms and risks.  Hormone replacement therapy may reduce some of these symptoms and risks. Talk to your health care provider about whether hormone replacement therapy is right for you. Follow these instructions at home:  Schedule regular health, dental, and eye exams.  Stay current with your immunizations.  Do not use any tobacco products including cigarettes, chewing tobacco, or  electronic cigarettes.  If you are pregnant, do not drink alcohol.  If you are breastfeeding, limit how much and how often you drink alcohol.  Limit alcohol intake to no more than 1 drink per day for nonpregnant women. One drink equals 12 ounces of beer, 5 ounces of wine, or 1 ounces of hard liquor.  Do not use street drugs.  Do not share needles.  Ask your health care provider for help if you need support or information about quitting drugs.  Tell your health care provider if you often feel depressed.  Tell your health care provider if you have ever been abused or do not feel safe at home. This information is not intended to replace advice given to you by your health care provider. Make sure you discuss any questions you have with your health care provider. Document Released: 07/26/2010 Document Revised: 06/18/2015 Document Reviewed: 10/14/2014 Elsevier Interactive Patient Education  2019 [employer].

## 2018-03-15 ENCOUNTER — Encounter: Payer: Self-pay | Admitting: Family Medicine

## 2018-03-15 ENCOUNTER — Ambulatory Visit (INDEPENDENT_AMBULATORY_CARE_PROVIDER_SITE_OTHER): Payer: BLUE CROSS/BLUE SHIELD | Admitting: Family Medicine

## 2018-03-15 VITALS — BP 136/82 | HR 84 | Temp 98.1°F | Resp 16 | Ht 65.0 in | Wt 139.0 lb

## 2018-03-15 DIAGNOSIS — Z13220 Encounter for screening for lipoid disorders: Secondary | ICD-10-CM | POA: Diagnosis not present

## 2018-03-15 DIAGNOSIS — Z130 Encounter for screening for diseases of the blood and blood-forming organs and certain disorders involving the immune mechanism: Secondary | ICD-10-CM | POA: Diagnosis not present

## 2018-03-15 DIAGNOSIS — Z0000 Encounter for general adult medical examination without abnormal findings: Secondary | ICD-10-CM | POA: Diagnosis not present

## 2018-03-15 DIAGNOSIS — Z131 Encounter for screening for diabetes mellitus: Secondary | ICD-10-CM

## 2018-03-15 LAB — LIPID PANEL
CHOLESTEROL: 157 mg/dL (ref 0–200)
HDL: 51.6 mg/dL (ref >39.00)
LDL CALC: 69 mg/dL (ref 0–99)
NonHDL: 105.12
TRIGLYCERIDES: 181 mg/dL — AB (ref 0.0–149.0)
Total CHOL/HDL Ratio: 3
VLDL: 36.2 mg/dL (ref 0.0–40.0)

## 2018-03-15 LAB — CBC
HEMATOCRIT: 39.9 % (ref 36.0–46.0)
HEMOGLOBIN: 13.6 g/dL (ref 12.0–15.0)
MCHC: 34.1 g/dL (ref 30.0–36.0)
MCV: 93.7 fl (ref 78.0–100.0)
Platelets: 277 10*3/uL (ref 150.0–400.0)
RBC: 4.26 Mil/uL (ref 3.87–5.11)
RDW: 12.7 % (ref 11.5–15.5)
WBC: 7.3 10*3/uL (ref 4.0–10.5)

## 2018-03-15 LAB — COMPREHENSIVE METABOLIC PANEL
ALK PHOS: 42 U/L (ref 39–117)
ALT: 15 U/L (ref 0–35)
AST: 17 U/L (ref 0–37)
Albumin: 4.4 g/dL (ref 3.5–5.2)
BILIRUBIN TOTAL: 0.5 mg/dL (ref 0.2–1.2)
BUN: 6 mg/dL (ref 6–23)
CO2: 23 meq/L (ref 19–32)
Calcium: 9.1 mg/dL (ref 8.4–10.5)
Chloride: 110 mEq/L (ref 96–112)
Creatinine, Ser: 0.74 mg/dL (ref 0.40–1.20)
GFR: 85.42 mL/min (ref >60.00)
GLUCOSE: 86 mg/dL (ref 70–99)
Potassium: 4.3 mEq/L (ref 3.5–5.1)
Sodium: 141 mEq/L (ref 135–145)
Total Protein: 6.9 g/dL (ref 6.0–8.3)

## 2018-03-15 LAB — HEMOGLOBIN A1C: Hgb A1c MFr Bld: 5.1 % (ref 4.6–6.5)

## 2018-08-06 IMAGING — US US ABDOMEN LIMITED
1 series · 14 of 25 positions shown · non-contrast
Comparison: No prior .

CLINICAL DATA: Vomiting.  Bloating.

EXAM:
ULTRASOUND ABDOMEN LIMITED RIGHT UPPER QUADRANT

[Series 1: us abdomen limited · 0.21mm/px · 14 of 99 slices shown]
[im 1/99]
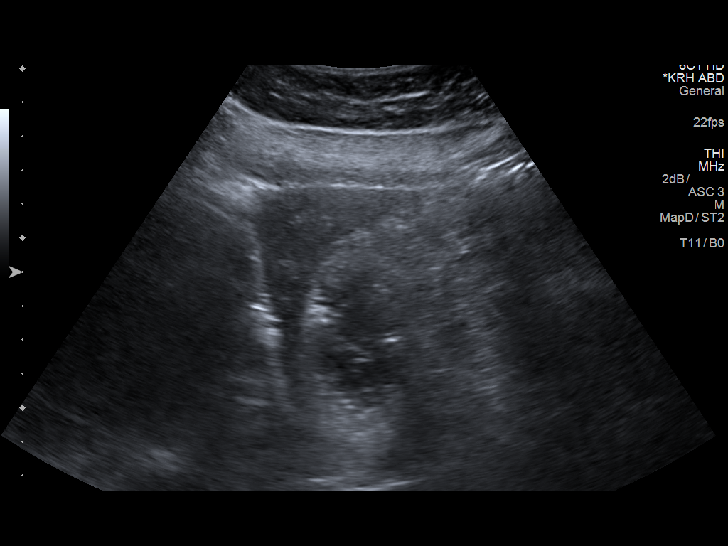
[im 9/99]
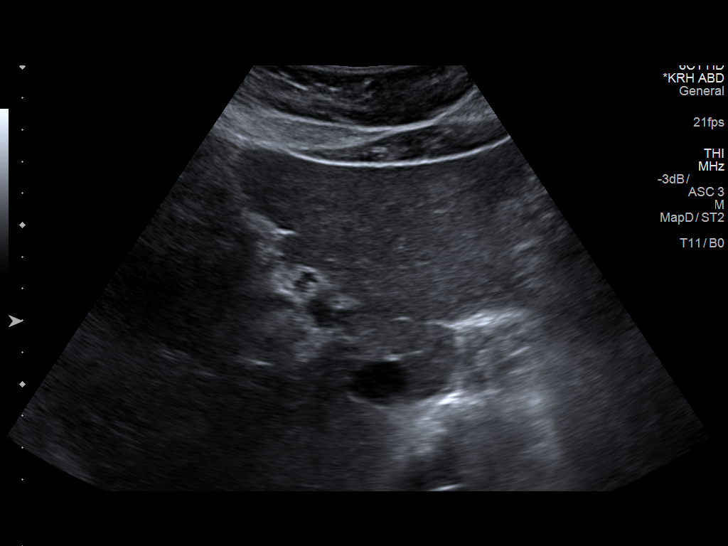
[im 17/99]
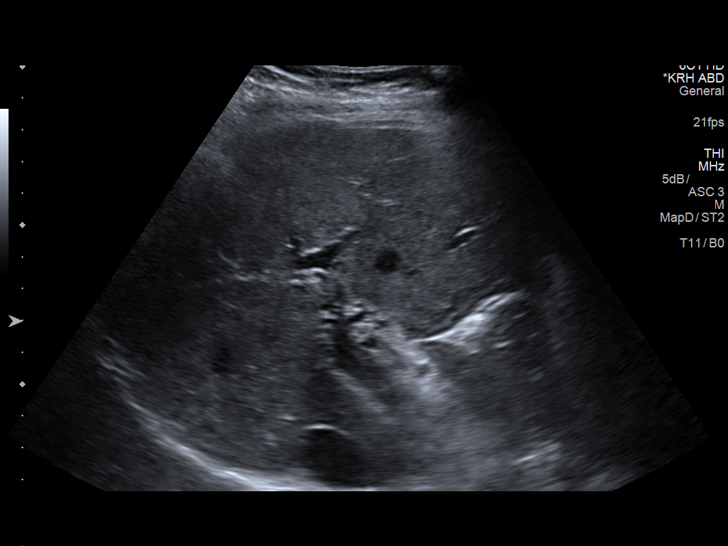
[im 25/99]
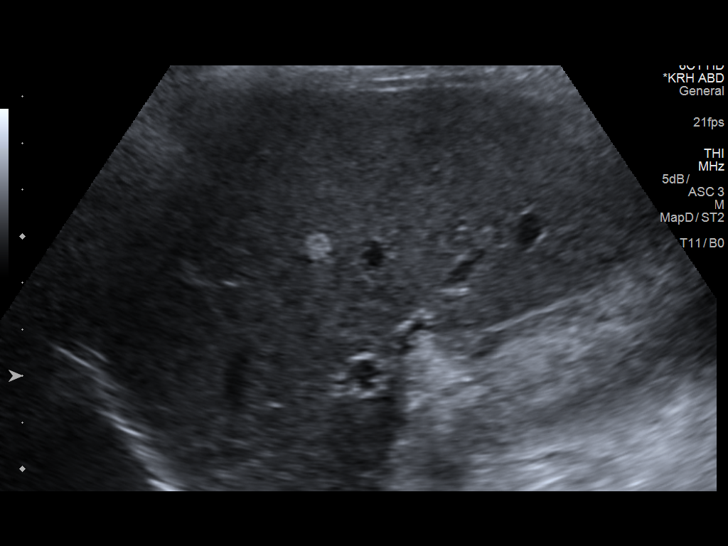
[im 33/99]
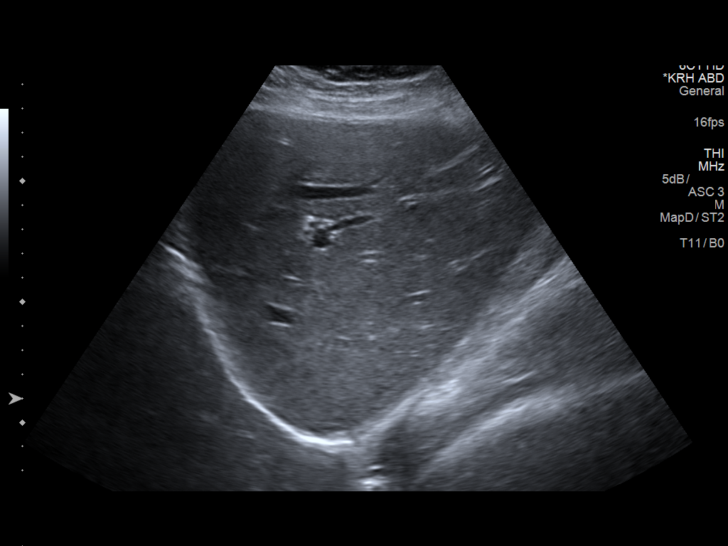
[im 37/99]
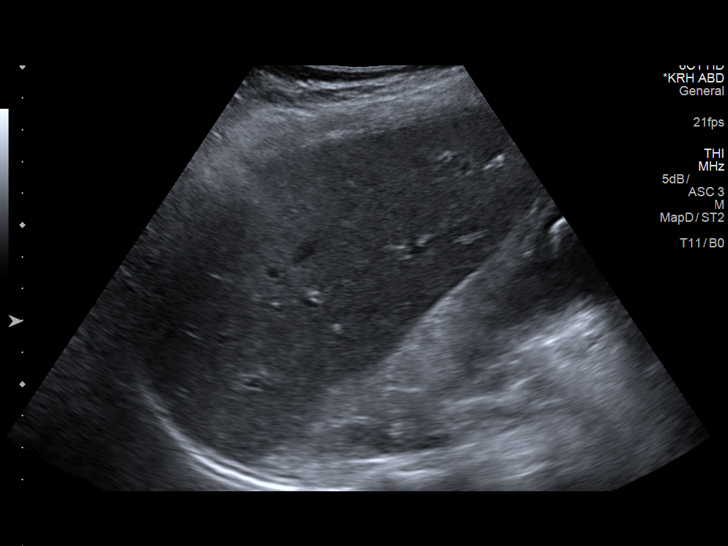
[im 45/99]
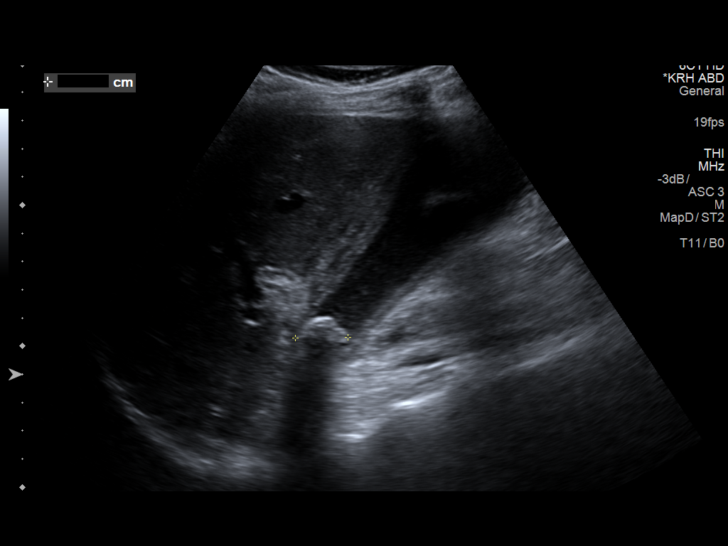
[im 54/99]
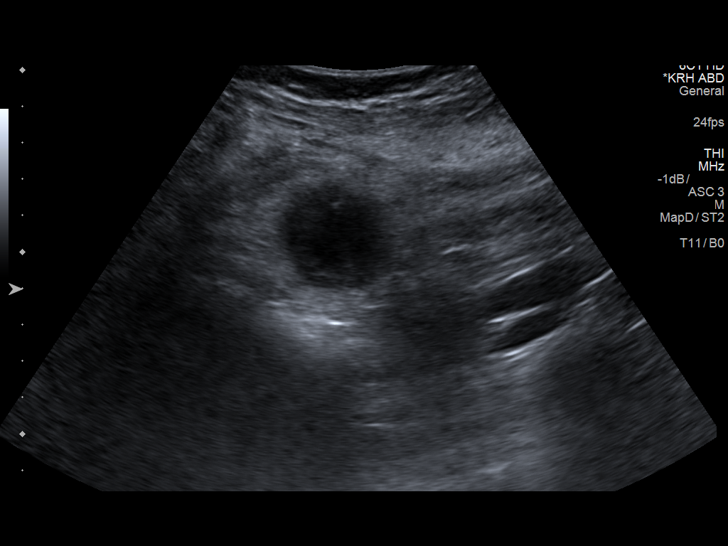
[im 62/99]
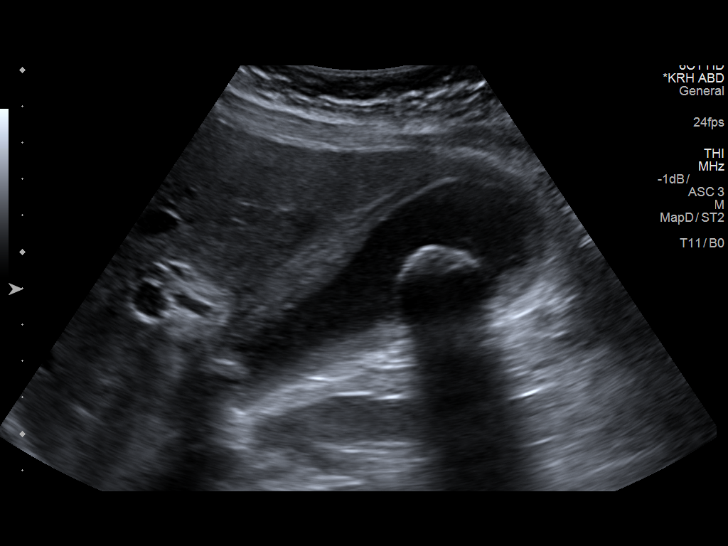
[im 66/99]
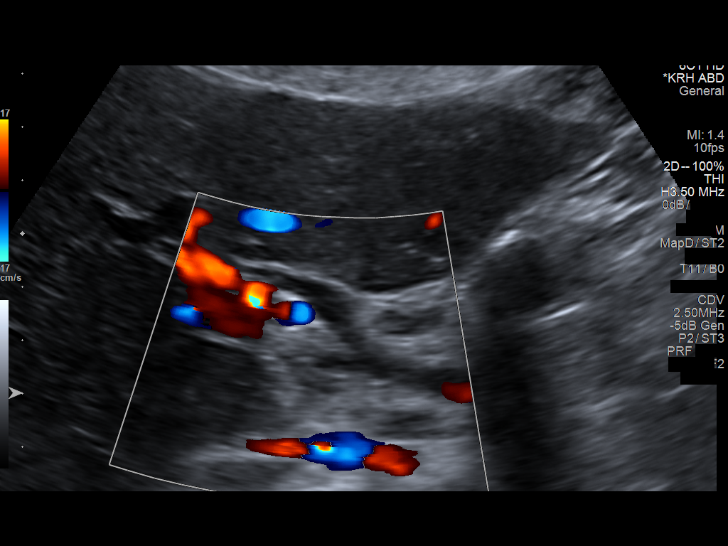
[im 74/99]
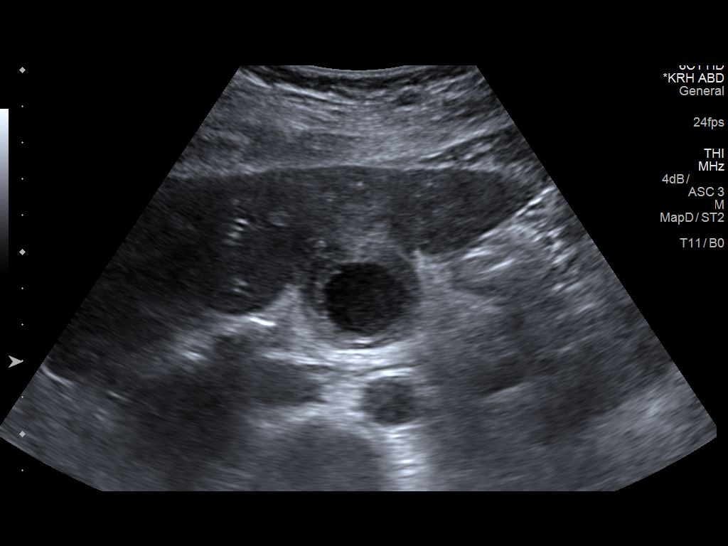
[im 82/99]
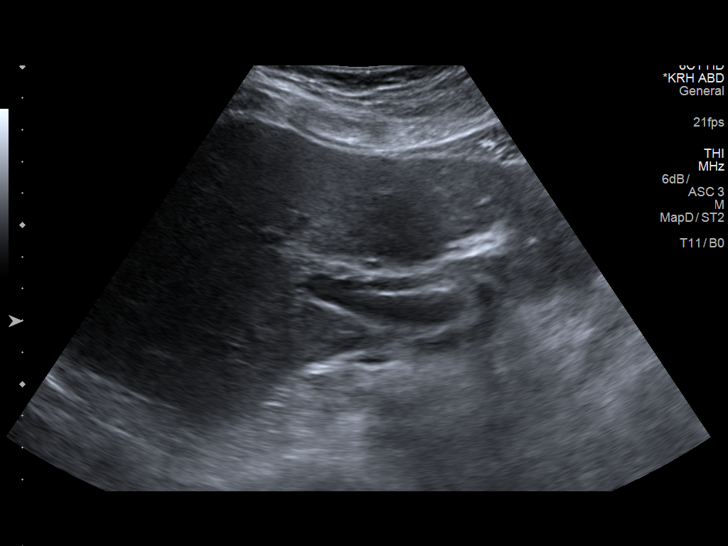
[im 90/99]
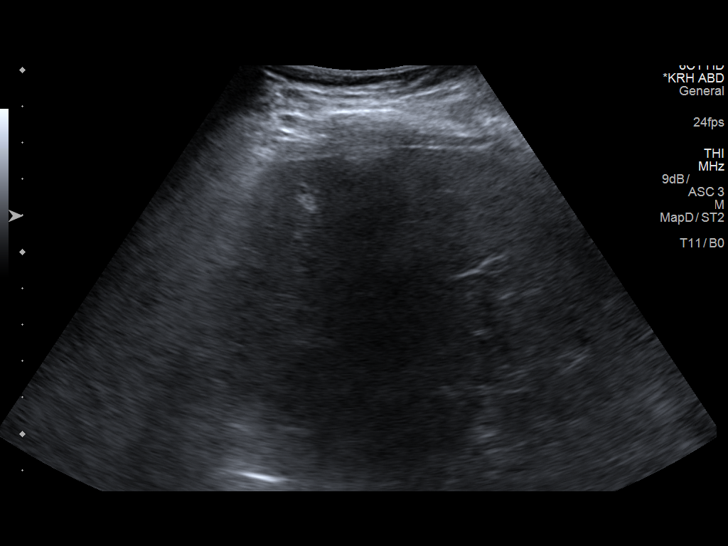
[im 99/99]
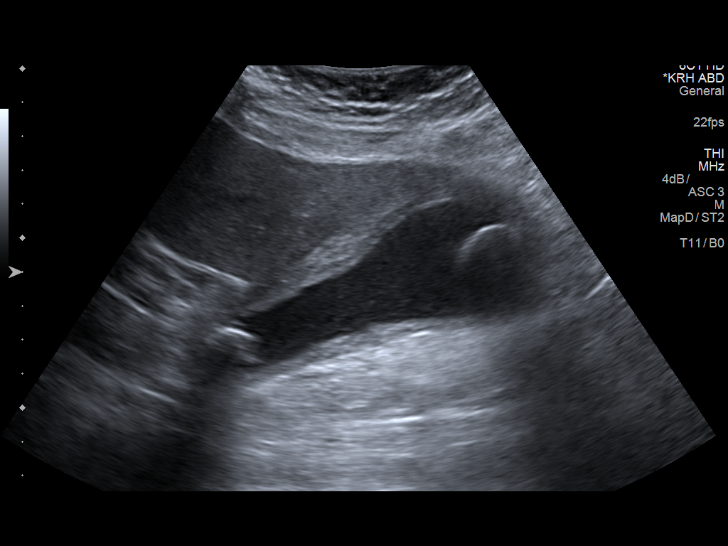

[14 of 25 positions shown; findings below may reference images not displayed]

FINDINGS: Gallbladder:

Multiple gallstones. Gallbladder wall is thickened at 10.5 mm. Mild
amount of pericholecystic fluid noted. Cholecystitis cannot be
excluded. Negative Murphy sign.

Common bile duct:

Diameter: 5.5 mm

Liver:

Two tiny hyperechoic hepatic nodules measuring 7 and 6 mm are noted,
most likely tiny hemangiomas. Portal vein is patent on color Doppler
imaging with normal direction of blood flow towards the liver.
IMPRESSION: 1. Multiple gallstones. Prominent thickening of the gallbladder wall
at 10.5 mm. Mild amount of pericholecystic fluid. Cholecystitis
cannot be excluded. No biliary distention.

2. Two tiny 7 at 6 mm hyperechoic hepatic nodules, most likely tiny
hemangiomas P

## 2019-02-26 ENCOUNTER — Ambulatory Visit: Payer: BC Managed Care – PPO | Attending: Internal Medicine

## 2019-02-26 DIAGNOSIS — Z20822 Contact with and (suspected) exposure to covid-19: Secondary | ICD-10-CM

## 2019-02-27 LAB — NOVEL CORONAVIRUS, NAA: SARS-CoV-2, NAA: NOT DETECTED

## 2019-03-16 NOTE — Progress Notes (Deleted)
 Healthcare at [facility] [address], [address] [place], [place] [address] [phone]  Date:  03/18/2019   Name:  ALIJAH HYDE   DOB:  Oct 13, 1975   MRN:  [id number]  PCP:  Pearline Cables, MD    Chief Complaint: No chief complaint on file.   History of Present Illness:  Forest L Knezevic is a 44 y.o. very pleasant female patient who presents with the following:  Generally healthy woman here today for physical exam She is status post cholecystectomy Last seen by myself about 1 year ago for physical  She has GYN care, per Dr. 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   No past medical history on file.  Past Surgical History:  Procedure Laterality Date  . CESAREAN SECTION  05/23/20111  . GALLBLADDER SURGERY  12/20/2016    Social History   Tobacco Use  . Smoking status: Never Smoker  . Smokeless tobacco: Never Used  Substance Use Topics  . Alcohol use: Yes    Comment: occasional  . Drug use: No    Family History  Problem Relation Age of Onset  . Hypertension Mother     No Known Allergies  Medication list has been reviewed and updated.  No current outpatient medications on file prior to visit.   No current facility-administered medications on file prior to visit.    Review of Systems:  As per HPI- otherwise negative.   Physical Examination: There were no vitals filed for this visit. There were no vitals filed for this visit. There is no height or weight on file to calculate BMI. Ideal Body Weight:    GEN: no acute distress. HEENT: Atraumatic, Normocephalic.  Ears and Nose: No external deformity. CV: RRR, No M/G/R. No JVD. No thrill. No extra heart  sounds. PULM: CTA B, no wheezes, crackles, rhonchi. No retractions. No resp. distress. No accessory muscle use. ABD: S, NT, ND, +BS. No rebound. No HSM. EXTR: No c/c/e PSYCH: Normally interactive. Conversant.    Assessment and Plan: *** This visit occurred during the SARS-CoV-2 public health emergency.  Safety protocols were in place, including screening questions prior to the visit, additional usage of staff PPE, and extensive cleaning of exam room while observing appropriate contact time as indicated for disinfecting solutions.    Signed Abbe Amsterdam, MD

## 2019-03-18 ENCOUNTER — Ambulatory Visit (INDEPENDENT_AMBULATORY_CARE_PROVIDER_SITE_OTHER): Payer: BC Managed Care – PPO | Admitting: Family Medicine

## 2019-03-18 DIAGNOSIS — Z1329 Encounter for screening for other suspected endocrine disorder: Secondary | ICD-10-CM

## 2019-03-18 DIAGNOSIS — Z0289 Encounter for other administrative examinations: Secondary | ICD-10-CM

## 2019-03-18 DIAGNOSIS — Z131 Encounter for screening for diabetes mellitus: Secondary | ICD-10-CM

## 2019-03-18 DIAGNOSIS — Z114 Encounter for screening for human immunodeficiency virus [HIV]: Secondary | ICD-10-CM

## 2019-03-18 DIAGNOSIS — Z13220 Encounter for screening for lipoid disorders: Secondary | ICD-10-CM

## 2019-03-18 DIAGNOSIS — Z0000 Encounter for general adult medical examination without abnormal findings: Secondary | ICD-10-CM

## 2019-03-18 DIAGNOSIS — Z130 Encounter for screening for diseases of the blood and blood-forming organs and certain disorders involving the immune mechanism: Secondary | ICD-10-CM

## 2019-05-24 ENCOUNTER — Telehealth: Payer: Self-pay | Admitting: Family Medicine

## 2019-05-24 NOTE — Telephone Encounter (Signed)
Caller: Siona Call Back # [phone]  NO SHOW FEE    Patient states that she cancelled appointment through automated system. Patient would like $ 50 no show FEE removed.   Please Advise

## 2019-06-04 ENCOUNTER — Telehealth: Payer: Self-pay

## 2019-06-04 NOTE — Telephone Encounter (Signed)
Spoke with patient to let her know I will send an email to [employer] today to ask for removal of $50.00 No Show Fee.

## 2019-06-04 NOTE — Telephone Encounter (Signed)
Patient called in needing to speak with Melanie Mcgrath about a no show bill that was back on DOS in feb. Per the patient the $50 charge was suppose to be taken off.  Please give the patient a call at [phone]

## 2019-10-15 DIAGNOSIS — L700 Acne vulgaris: Secondary | ICD-10-CM | POA: Diagnosis not present

## 2019-10-15 DIAGNOSIS — D2339 Other benign neoplasm of skin of other parts of face: Secondary | ICD-10-CM | POA: Diagnosis not present

## 2021-05-21 ENCOUNTER — Telehealth: Payer: Self-pay | Admitting: Family Medicine

## 2021-05-21 NOTE — Telephone Encounter (Signed)
Pt called stating she wanted to schedule a physical with Dr. Patsy Lager. After reviewing her chart, noticed she has not been seen in our office since 2.20.2020. Advised pt that if she wants to re-establish care with Dr. Patsy Lager, she would have to approve it since she is not accepting new pts at this time. Is it ok to re-establish care with this pt? Please Advise. ?

## 2021-05-21 NOTE — Telephone Encounter (Signed)
Pt. Scheduled

## 2021-05-21 NOTE — Telephone Encounter (Deleted)
Patient was last seen 02/2019.  She had a cpe with [facility].  She should be ok to schedule appointment. ?

## 2021-06-26 NOTE — Patient Instructions (Addendum)
Good to see you again today, I will be in touch with your lab work asap Tetanus booster today  I would suggest getting a covid booster if not done recently!   We will send you a cologuard kit to screen for colon cancer at home Please do be sure to schedule a visit with your GYN asap

## 2021-06-26 NOTE — Progress Notes (Addendum)
[facility] [address], [address] [place], [place] [address] [phone]  Date:  06/28/2021   Name:  Melanie Mcgrath   DOB:  1975-07-29   MRN:  [id number]  PCP:  Pearline Cables, MD    Chief Complaint: Re establish care (Concerns/ questions: none/Hep C screen due/Tdap: none in ncir/Pap: sees Gyn/Colonoscopy: none yet)   History of Present Illness:  Melanie Mcgrath is a 46 y.o. very pleasant female patient who presents with the following:  Patient seen today to reestablish care, I last saw her about 3 years ago Pt notes she has been traveling a lot the last several months which is harder on her diet  She is up a bit on her weight She had just a light breakfast this am so far   Wt Readings from Last 3 Encounters:  06/28/21 142 lb 3.2 oz (64.5 kg)  03/15/18 139 lb (63 kg)  02/13/17 131 lb 6.4 oz (59.6 kg)    History of cholecystectomy, family history of hypertension  She is married, has an 60 year old daughter.  She is doing well.  At her last visit she was working for [employer], very busy with work She continues to be quite busy, has to do quite a bit of traveling. She is trying to exercise regularly  Tdap- we will update today  Colon cancer screening. She would like to do cologuard after discussion of options, no family history COVID-19 booster-recommended Pap smear- per her GYN, she will schedule  Mammogram- per her GYN Due for labs today- will do today  Never smoker There are no problems to display for this patient.   No past medical history on file.  Past Surgical History:  Procedure Laterality Date   CESAREAN SECTION  05/23/20111   GALLBLADDER SURGERY  12/20/2016   TUBAL LIGATION      Social History   Tobacco Use   Smoking status: Never   Smokeless tobacco: Never  Substance Use Topics   Alcohol use: Yes    Comment: occasional   Drug use: No    Family History  Problem Relation Age of Onset    Hypertension Mother     No Known Allergies  Medication list has been reviewed and updated.  No current outpatient medications on file prior to visit.   No current facility-administered medications on file prior to visit.    Review of Systems:  As per HPI- otherwise negative.   Physical Examination: Vitals:   06/28/21 0857  BP: 132/80  Pulse: 86  Resp: 18  Temp: 98 F (36.7 C)  SpO2: 98%   Vitals:   06/28/21 0857  Weight: 142 lb 3.2 oz (64.5 kg)  Height: 5\' 4"  (1.626 m)   Body mass index is 24.41 kg/m. Ideal Body Weight: Weight in (lb) to have BMI = 25: 145.3  GEN: no acute distress. Normal weight, looks well  HEENT: Atraumatic, Normocephalic.  Bilateral TM wnl, oropharynx normal.  PEERL,EOMI.   Ears and Nose: No external deformity. CV: RRR, No M/G/R. No JVD. No thrill. No extra heart sounds. PULM: CTA B, no wheezes, crackles, rhonchi. No retractions. No resp. distress. No accessory muscle use. ABD: S, NT, ND, +BS. No rebound. No HSM. EXTR: No c/c/e PSYCH: Normally interactive. Conversant.    Assessment and Plan: Physical exam  Screening for hyperlipidemia - Plan: Lipid panel  Screening for thyroid disorder - Plan: TSH  Encounter for hepatitis C screening test for low  risk patient - Plan: Hepatitis C antibody  Screening for diabetes mellitus - Plan: Comprehensive metabolic panel, Hemoglobin A1c  Screening for deficiency anemia - Plan: CBC  Screening for colon cancer - Plan: Cologuard  Immunization due - Plan: Tdap vaccine greater than or equal to 7yo IM  Vegan diet - Plan: B12  Acne, unspecified acne type - Plan: clindamycin-benzoyl peroxide (BENZACLIN) gel  Patient seen today for physical exam Labs are pending as above Updated Tdap, order Cologuard She follows a mostly vegan diet, check B12 Encouraged healthy diet and exercise routine  Signed Abbe Amsterdam, MD  Received labs as below, message to patient Results for orders placed or  performed in visit on 06/28/21  CBC  Result Value Ref Range   WBC 8.9 4.0 - 10.5 K/uL   RBC 4.14 3.87 - 5.11 Mil/uL   Platelets 304.0 150.0 - 400.0 K/uL   Hemoglobin 12.8 12.0 - 15.0 g/dL   HCT 30.1 60.1 - 09.3 %   MCV 92.5 78.0 - 100.0 fl   MCHC 33.5 30.0 - 36.0 g/dL   RDW 23.5 57.3 - 22.0 %  Comprehensive metabolic panel  Result Value Ref Range   Sodium 139 135 - 145 mEq/L   Potassium 4.5 3.5 - 5.1 mEq/L   Chloride 107 96 - 112 mEq/L   CO2 24 19 - 32 mEq/L   Glucose, Bld 90 70 - 99 mg/dL   BUN 9 6 - 23 mg/dL   Creatinine, Ser 2.54 0.40 - 1.20 mg/dL   Total Bilirubin 0.6 0.2 - 1.2 mg/dL   Alkaline Phosphatase 48 39 - 117 U/L   AST 16 0 - 37 U/L   ALT 14 0 - 35 U/L   Total Protein 6.9 6.0 - 8.3 g/dL   Albumin 4.3 3.5 - 5.2 g/dL   GFR 27.06 >23.76 mL/min   Calcium 9.4 8.4 - 10.5 mg/dL  Hemoglobin E8B  Result Value Ref Range   Hgb A1c MFr Bld 5.4 4.6 - 6.5 %  Lipid panel  Result Value Ref Range   Cholesterol 183 0 - 200 mg/dL   Triglycerides 151.7 (H) 0.0 - 149.0 mg/dL   HDL 61.60 >73.71 mg/dL   VLDL 06.2 (H) 0.0 - 69.4 mg/dL   Total CHOL/HDL Ratio 4    NonHDL 130.79   TSH  Result Value Ref Range   TSH 1.92 0.35 - 5.50 uIU/mL  B12  Result Value Ref Range   Vitamin B-12 146 (L) 211 - 911 pg/mL  LDL cholesterol, direct  Result Value Ref Range   Direct LDL 106.0 mg/dL   The 85-IOEV ASCVD risk score (Arnett DK, et al., 2019) is: 0.9%   Values used to calculate the score:     Age: 51 years     Sex: Female     Is Non-Hispanic African American: No     Diabetic: No     Tobacco smoker: No     Systolic Blood Pressure: 132 mmHg     Is BP treated: No     HDL Cholesterol: 52.3 mg/dL     Total Cholesterol: 183 mg/dL

## 2021-06-28 ENCOUNTER — Encounter: Payer: Self-pay | Admitting: Family Medicine

## 2021-06-28 ENCOUNTER — Ambulatory Visit: Payer: BC Managed Care – PPO | Admitting: Family Medicine

## 2021-06-28 VITALS — BP 132/80 | HR 86 | Temp 98.0°F | Resp 18 | Ht 64.0 in | Wt 142.2 lb

## 2021-06-28 DIAGNOSIS — Z1329 Encounter for screening for other suspected endocrine disorder: Secondary | ICD-10-CM | POA: Diagnosis not present

## 2021-06-28 DIAGNOSIS — Z1211 Encounter for screening for malignant neoplasm of colon: Secondary | ICD-10-CM

## 2021-06-28 DIAGNOSIS — Z0000 Encounter for general adult medical examination without abnormal findings: Secondary | ICD-10-CM

## 2021-06-28 DIAGNOSIS — Z131 Encounter for screening for diabetes mellitus: Secondary | ICD-10-CM

## 2021-06-28 DIAGNOSIS — Z130 Encounter for screening for diseases of the blood and blood-forming organs and certain disorders involving the immune mechanism: Secondary | ICD-10-CM

## 2021-06-28 DIAGNOSIS — Z23 Encounter for immunization: Secondary | ICD-10-CM

## 2021-06-28 DIAGNOSIS — Z13220 Encounter for screening for lipoid disorders: Secondary | ICD-10-CM

## 2021-06-28 DIAGNOSIS — Z789 Other specified health status: Secondary | ICD-10-CM | POA: Diagnosis not present

## 2021-06-28 DIAGNOSIS — Z1159 Encounter for screening for other viral diseases: Secondary | ICD-10-CM | POA: Diagnosis not present

## 2021-06-28 DIAGNOSIS — L709 Acne, unspecified: Secondary | ICD-10-CM

## 2021-06-28 LAB — LIPID PANEL
Cholesterol: 183 mg/dL (ref 0–200)
HDL: 52.3 mg/dL (ref >39.00)
NonHDL: 130.79
Total CHOL/HDL Ratio: 4
Triglycerides: 245 mg/dL — ABNORMAL HIGH (ref 0.0–149.0)
VLDL: 49 mg/dL — ABNORMAL HIGH (ref 0.0–40.0)

## 2021-06-28 LAB — COMPREHENSIVE METABOLIC PANEL
ALT: 14 U/L (ref 0–35)
AST: 16 U/L (ref 0–37)
Albumin: 4.3 g/dL (ref 3.5–5.2)
Alkaline Phosphatase: 48 U/L (ref 39–117)
BUN: 9 mg/dL (ref 6–23)
CO2: 24 mEq/L (ref 19–32)
Calcium: 9.4 mg/dL (ref 8.4–10.5)
Chloride: 107 mEq/L (ref 96–112)
Creatinine, Ser: 0.8 mg/dL (ref 0.40–1.20)
GFR: 88.07 mL/min (ref >60.00)
Glucose, Bld: 90 mg/dL (ref 70–99)
Potassium: 4.5 mEq/L (ref 3.5–5.1)
Sodium: 139 mEq/L (ref 135–145)
Total Bilirubin: 0.6 mg/dL (ref 0.2–1.2)
Total Protein: 6.9 g/dL (ref 6.0–8.3)

## 2021-06-28 LAB — LDL CHOLESTEROL, DIRECT: Direct LDL: 106 mg/dL

## 2021-06-28 LAB — CBC
HCT: 38.3 % (ref 36.0–46.0)
Hemoglobin: 12.8 g/dL (ref 12.0–15.0)
MCHC: 33.5 g/dL (ref 30.0–36.0)
MCV: 92.5 fl (ref 78.0–100.0)
Platelets: 304 10*3/uL (ref 150.0–400.0)
RBC: 4.14 Mil/uL (ref 3.87–5.11)
RDW: 13.3 % (ref 11.5–15.5)
WBC: 8.9 10*3/uL (ref 4.0–10.5)

## 2021-06-28 LAB — HEMOGLOBIN A1C: Hgb A1c MFr Bld: 5.4 % (ref 4.6–6.5)

## 2021-06-28 LAB — TSH: TSH: 1.92 u[IU]/mL (ref 0.35–5.50)

## 2021-06-28 LAB — VITAMIN B12: Vitamin B-12: 146 pg/mL — ABNORMAL LOW (ref 211–911)

## 2021-06-28 MED ORDER — CLINDAMYCIN PHOS-BENZOYL PEROX 1-5 % EX GEL: Freq: Two times a day (BID) | CUTANEOUS | 3 refills | Status: DC

## 2021-06-29 LAB — HEPATITIS C ANTIBODY
Hepatitis C Ab: NONREACTIVE
SIGNAL TO CUT-OFF: 0.18 (ref <1.00)

## 2022-01-31 DIAGNOSIS — L700 Acne vulgaris: Secondary | ICD-10-CM | POA: Diagnosis not present

## 2022-01-31 DIAGNOSIS — L818 Other specified disorders of pigmentation: Secondary | ICD-10-CM | POA: Diagnosis not present

## 2022-03-11 ENCOUNTER — Encounter: Payer: Self-pay | Admitting: Family Medicine

## 2022-09-08 ENCOUNTER — Encounter (INDEPENDENT_AMBULATORY_CARE_PROVIDER_SITE_OTHER): Payer: Self-pay

## 2022-10-17 ENCOUNTER — Encounter: Payer: BC Managed Care – PPO | Admitting: Family Medicine

## 2022-11-16 ENCOUNTER — Encounter: Payer: BC Managed Care – PPO | Admitting: Family Medicine

## 2022-11-23 ENCOUNTER — Encounter: Payer: BC Managed Care – PPO | Admitting: Family Medicine

## 2022-11-29 ENCOUNTER — Encounter: Payer: Self-pay | Admitting: Family Medicine

## 2022-11-29 DIAGNOSIS — L709 Acne, unspecified: Secondary | ICD-10-CM

## 2022-11-29 MED ORDER — CLINDAMYCIN PHOS-BENZOYL PEROX 1-5 % EX GEL: Freq: Two times a day (BID) | CUTANEOUS | 3 refills | Status: AC

## 2023-02-22 ENCOUNTER — Encounter: Payer: BC Managed Care – PPO | Admitting: Family Medicine

## 2023-04-19 ENCOUNTER — Encounter: Payer: BC Managed Care – PPO | Admitting: Family Medicine

## 2023-05-11 ENCOUNTER — Encounter: Admitting: Family Medicine

## 2023-06-02 NOTE — Progress Notes (Unsigned)
 [facility] [address] [place], [place] [address] [phone]  Date:  06/07/2023   Name:  Melanie Mcgrath   DOB:  08-16-1975   MRN:  [id number]  PCP:  Melanie Partridge, MD    Chief Complaint: No chief complaint on file.   History of Present Illness:  Melanie Mcgrath is a 48 y.o. very pleasant female patient who presents with the following:  Patient seen today for physical exam.  Generally in good health.  Most recent visit with myself was June of last year for her physical  Married, daughter is 56 years old.  She works for [employer] and has to travel quite a bit She does try to exercise and stick to healthy diet but this is harder with her work travel  COVID booster Pap smear Colon cancer screening Mammogram Blood work completed 2 years ago, can update today History of B12 deficiency  There are no active problems to display for this patient.   No past medical history on file.  Past Surgical History:  Procedure Laterality Date   CESAREAN SECTION  05/23/20111   GALLBLADDER SURGERY  12/20/2016   TUBAL LIGATION      Social History   Tobacco Use   Smoking status: Never   Smokeless tobacco: Never  Substance Use Topics   Alcohol use: Yes    Comment: occasional   Drug use: No    Family History  Problem Relation Age of Onset   Hypertension Mother     No Known Allergies  Medication list has been reviewed and updated.  Current Outpatient Medications on File Prior to Visit  Medication Sig Dispense Refill   clindamycin -benzoyl peroxide (BENZACLIN) gel Apply topically 2 (two) times daily. Use as needed 50 g 3   No current facility-administered medications on file prior to visit.    Review of Systems:  As per HPI- otherwise negative.   Physical Examination: There were no vitals filed for this visit. There were no vitals filed for this visit. There is no height or weight on file to  calculate BMI. Ideal Body Weight:    GEN: no acute distress. HEENT: Atraumatic, Normocephalic.  Ears and Nose: No external deformity. CV: RRR, No M/G/R. No JVD. No thrill. No extra heart sounds. PULM: CTA B, no wheezes, crackles, rhonchi. No retractions. No resp. distress. No accessory muscle use. ABD: S, NT, ND, +BS. No rebound. No HSM. EXTR: No c/c/e PSYCH: Normally interactive. Conversant.    Assessment and Plan: *** Physical exam today.  Encouraged healthy diet and exercise routine Will plan further follow- up pending labs.  Signed Gates Kasal, MD

## 2023-06-02 NOTE — Patient Instructions (Incomplete)
 It was great to see you today, I will be in touch with your labs

## 2023-06-07 ENCOUNTER — Ambulatory Visit (INDEPENDENT_AMBULATORY_CARE_PROVIDER_SITE_OTHER): Admitting: Family Medicine

## 2023-06-07 ENCOUNTER — Encounter: Payer: Self-pay | Admitting: Family Medicine

## 2023-06-07 VITALS — BP 134/82 | HR 66 | Temp 97.8°F | Ht 64.0 in | Wt 144.6 lb

## 2023-06-07 DIAGNOSIS — E538 Deficiency of other specified B group vitamins: Secondary | ICD-10-CM

## 2023-06-07 DIAGNOSIS — Z130 Encounter for screening for diseases of the blood and blood-forming organs and certain disorders involving the immune mechanism: Secondary | ICD-10-CM | POA: Diagnosis not present

## 2023-06-07 DIAGNOSIS — Z0000 Encounter for general adult medical examination without abnormal findings: Secondary | ICD-10-CM | POA: Diagnosis not present

## 2023-06-07 DIAGNOSIS — Z1329 Encounter for screening for other suspected endocrine disorder: Secondary | ICD-10-CM | POA: Diagnosis not present

## 2023-06-07 DIAGNOSIS — Z1211 Encounter for screening for malignant neoplasm of colon: Secondary | ICD-10-CM

## 2023-06-07 DIAGNOSIS — Z13220 Encounter for screening for lipoid disorders: Secondary | ICD-10-CM | POA: Diagnosis not present

## 2023-06-07 DIAGNOSIS — Z131 Encounter for screening for diabetes mellitus: Secondary | ICD-10-CM

## 2023-06-07 DIAGNOSIS — E781 Pure hyperglyceridemia: Secondary | ICD-10-CM

## 2023-06-07 DIAGNOSIS — L700 Acne vulgaris: Secondary | ICD-10-CM

## 2023-06-07 DIAGNOSIS — Z789 Other specified health status: Secondary | ICD-10-CM

## 2023-06-07 LAB — COMPREHENSIVE METABOLIC PANEL WITH GFR
ALT: 15 U/L (ref 0–35)
AST: 15 U/L (ref 0–37)
Albumin: 4.3 g/dL (ref 3.5–5.2)
Alkaline Phosphatase: 53 U/L (ref 39–117)
BUN: 9 mg/dL (ref 6–23)
CO2: 23 meq/L (ref 19–32)
Calcium: 8.8 mg/dL (ref 8.4–10.5)
Chloride: 107 meq/L (ref 96–112)
Creatinine, Ser: 0.71 mg/dL (ref 0.40–1.20)
GFR: 100.26 mL/min (ref >60.00)
Glucose, Bld: 92 mg/dL (ref 70–99)
Potassium: 4 meq/L (ref 3.5–5.1)
Sodium: 139 meq/L (ref 135–145)
Total Bilirubin: 0.3 mg/dL (ref 0.2–1.2)
Total Protein: 6.9 g/dL (ref 6.0–8.3)

## 2023-06-07 LAB — LIPID PANEL
Cholesterol: 186 mg/dL (ref 0–200)
HDL: 49.8 mg/dL (ref >39.00)
LDL Cholesterol: 100 mg/dL — ABNORMAL HIGH (ref 0–99)
NonHDL: 136.22
Total CHOL/HDL Ratio: 4
Triglycerides: 183 mg/dL — ABNORMAL HIGH (ref 0.0–149.0)
VLDL: 36.6 mg/dL (ref 0.0–40.0)

## 2023-06-07 LAB — CBC
HCT: 38.9 % (ref 36.0–46.0)
Hemoglobin: 12.9 g/dL (ref 12.0–15.0)
MCHC: 33.2 g/dL (ref 30.0–36.0)
MCV: 92.1 fl (ref 78.0–100.0)
Platelets: 341 10*3/uL (ref 150.0–400.0)
RBC: 4.22 Mil/uL (ref 3.87–5.11)
RDW: 14.7 % (ref 11.5–15.5)
WBC: 6.5 10*3/uL (ref 4.0–10.5)

## 2023-06-07 LAB — VITAMIN D 25 HYDROXY (VIT D DEFICIENCY, FRACTURES): VITD: 26.15 ng/mL — ABNORMAL LOW (ref 30.00–100.00)

## 2023-06-07 LAB — TSH: TSH: 1.81 u[IU]/mL (ref 0.35–5.50)

## 2023-06-07 LAB — VITAMIN B12: Vitamin B-12: 157 pg/mL — ABNORMAL LOW (ref 211–911)

## 2023-06-07 LAB — HEMOGLOBIN A1C: Hgb A1c MFr Bld: 5.5 % (ref 4.6–6.5)

## 2023-06-07 MED ORDER — TRETINOIN 0.025 % EX CREA: TOPICAL_CREAM | Freq: Every day | CUTANEOUS | 1 refills | Status: AC

## 2023-06-07 NOTE — Addendum Note (Signed)
 Addended by: Gates Kasal C on: 06/07/2023 04:06 PM   Modules accepted: Orders

## 2023-11-07 DIAGNOSIS — H00025 Hordeolum internum left lower eyelid: Secondary | ICD-10-CM | POA: Diagnosis not present

## 2023-12-08 ENCOUNTER — Other Ambulatory Visit

## 2023-12-29 ENCOUNTER — Other Ambulatory Visit

## 2024-02-16 ENCOUNTER — Other Ambulatory Visit

## 2024-03-20 ENCOUNTER — Other Ambulatory Visit
